# Patient Record
Sex: Male | Born: 1970 | Race: White | Hispanic: No | Marital: Married | Smoking: Current every day smoker
Health system: Southern US, Community
[De-identification: ages and names within clinical notes are randomized; demographics above are authoritative.]

---

## 2003-04-26 ENCOUNTER — Emergency Department (HOSPITAL_COMMUNITY): Admission: EM | Admit: 2003-04-26 | Discharge: 2003-04-26 | Payer: Self-pay | Admitting: Emergency Medicine

## 2007-04-09 ENCOUNTER — Emergency Department (HOSPITAL_COMMUNITY): Admission: EM | Admit: 2007-04-09 | Discharge: 2007-04-09 | Payer: Self-pay | Admitting: *Deleted

## 2009-07-08 ENCOUNTER — Emergency Department: Payer: Self-pay | Admitting: Emergency Medicine

## 2011-08-03 IMAGING — CT CT HEAD WITHOUT CONTRAST
2 series · 16 of 30 positions shown, 20 images · non-contrast
Comparison: none

REASON FOR EXAM: chainsaw hit head, numbness right side of head
COMMENTS:

PROCEDURE:     CT  - CT HEAD WITHOUT CONTRAST  - July 08, 2009  [DATE]
RESULT:     Comparison:  None
TECHNIQUE: Multiple axial images from the foramen magnum to the vertex were
obtained without IV contrast.

[Series 2: without · axial · non-contrast · 0.39mm/px · z∈[-478,-358]mm · 13 of 30 slices shown, 17 images]
[im 3/30  brain]
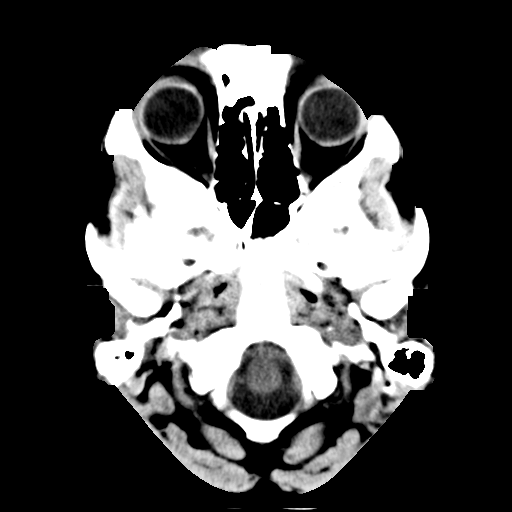
[im 3/30  bone]
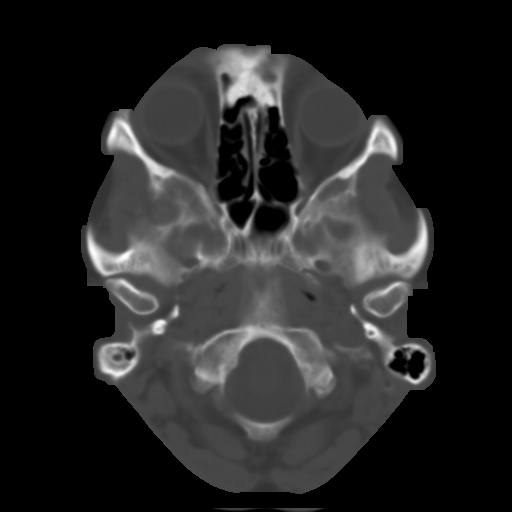
[im 5/30  brain]
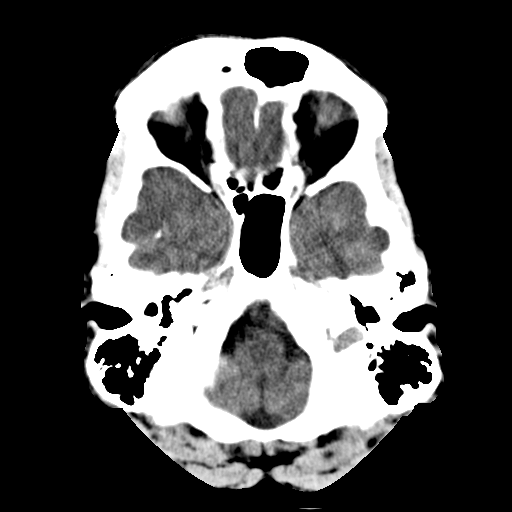
[im 7/30  brain]
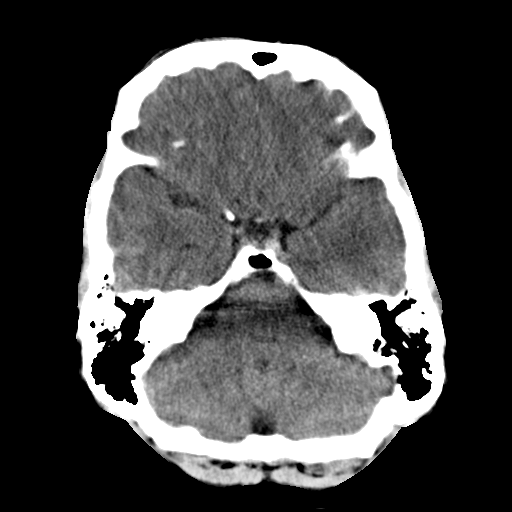
[im 9/30  brain]
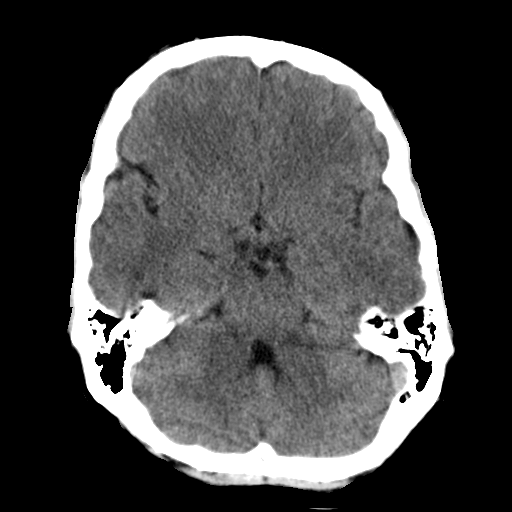
[im 11/30  brain]
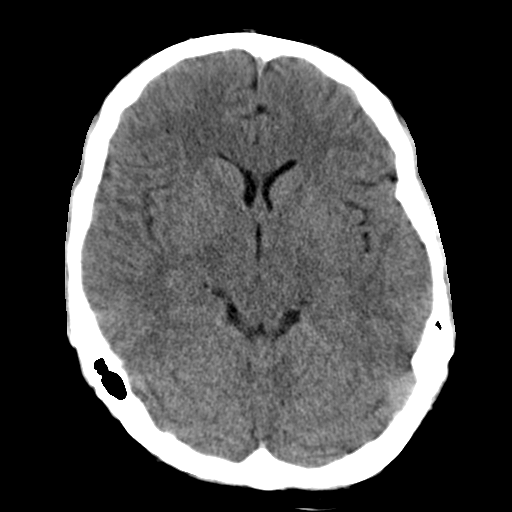
[im 11/30  bone]
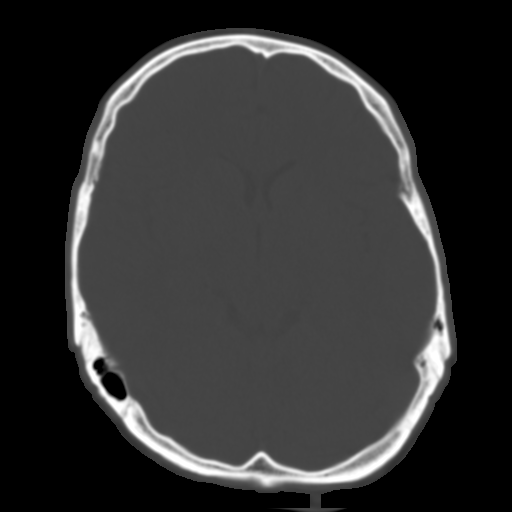
[im 13/30  brain]
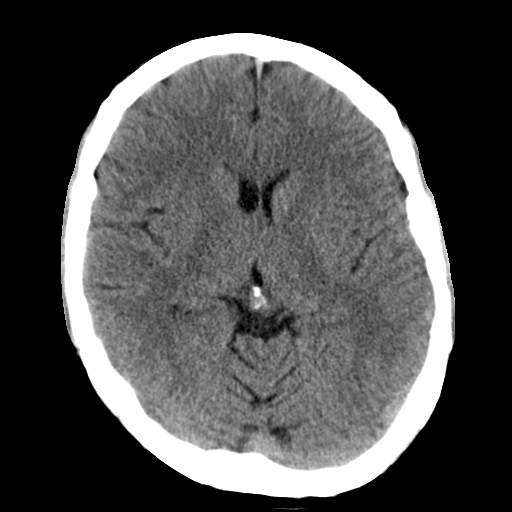
[im 15/30  brain]
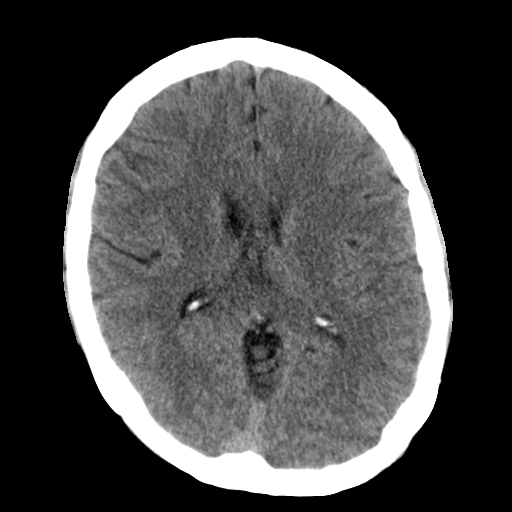
[im 17/30  brain]
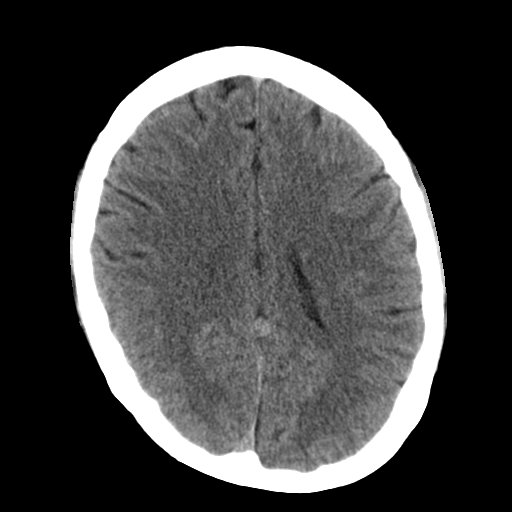
[im 19/30  brain]
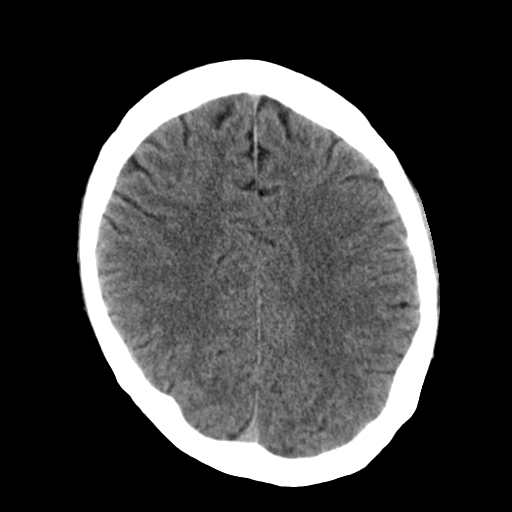
[im 19/30  bone]
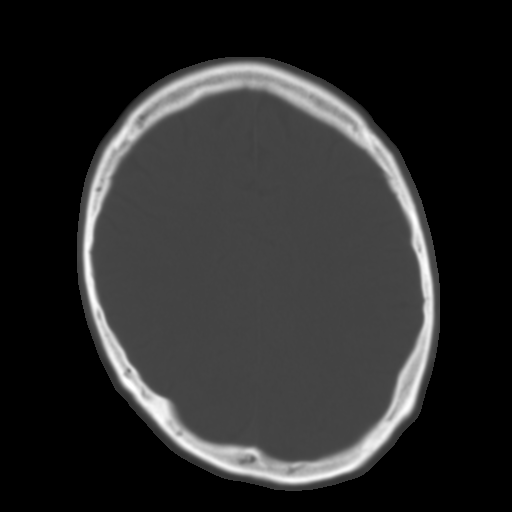
[im 21/30  brain]
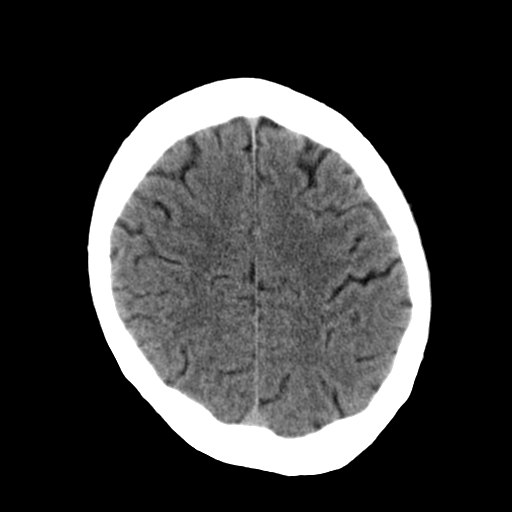
[im 23/30  brain]
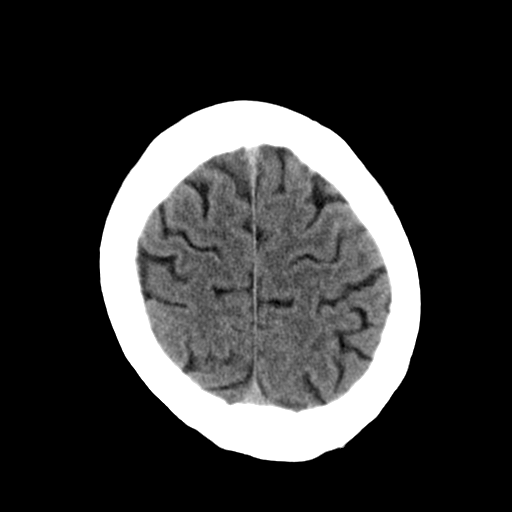
[im 25/30  brain]
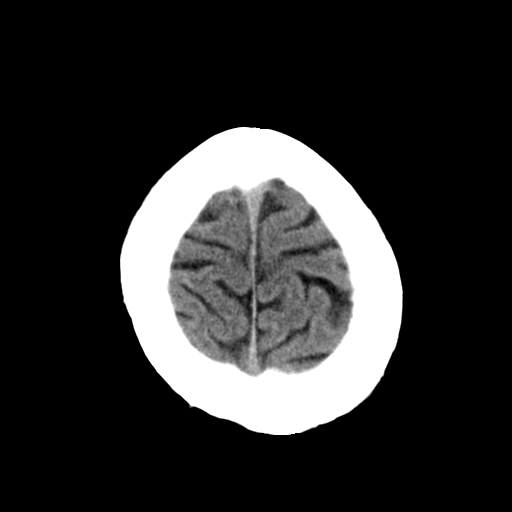
[im 27/30  brain]
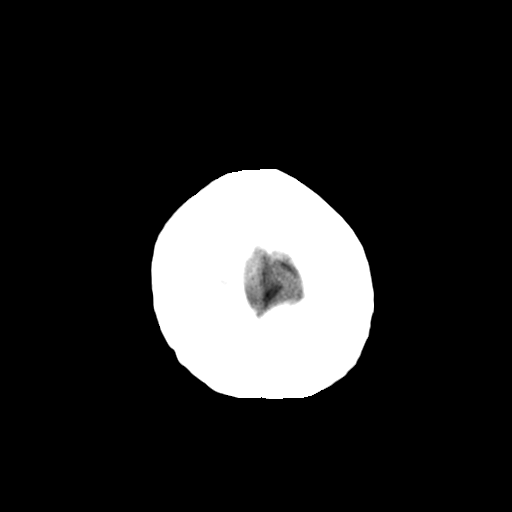
[im 27/30  bone]
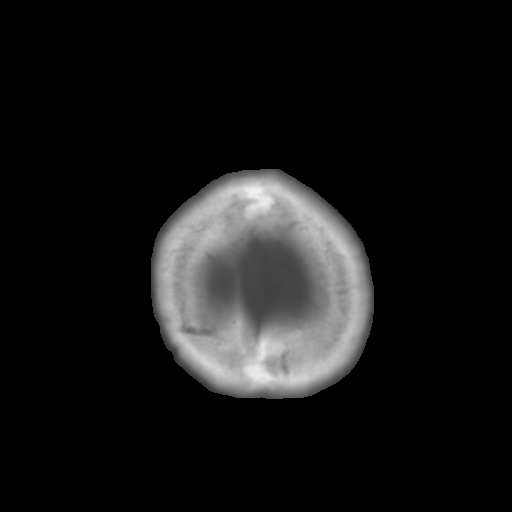

[Series 3: bone · axial · 0.39mm/px · z∈[-478,-438]mm · 3 of 30 slices shown]
[im 3/30  bone]
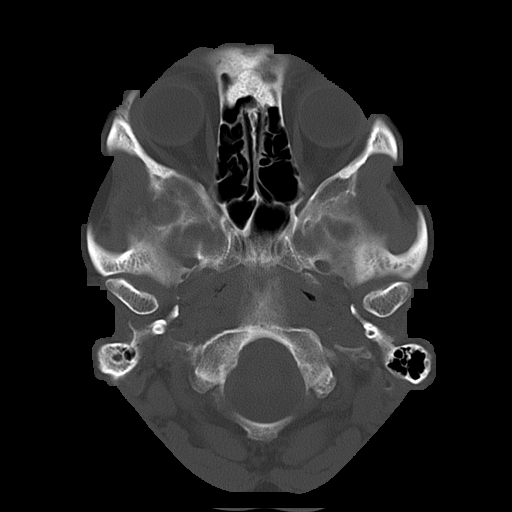
[im 7/30  bone]
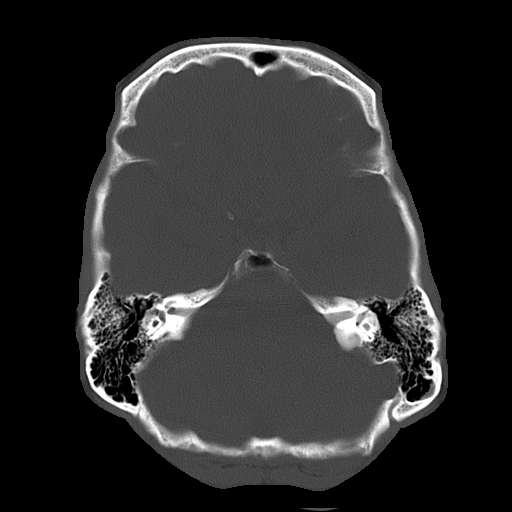
[im 11/30  bone]
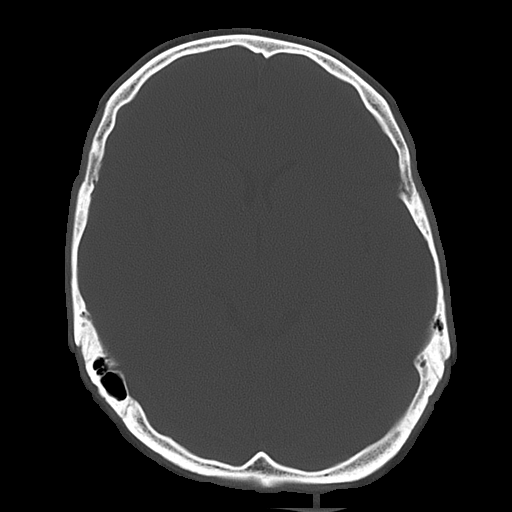

[16 of 30 positions shown; findings below may reference images not displayed]

FINDINGS: There is no evidence of mass effect, midline shift, or extra-axial fluid
collections.  There is no evidence of a space-occupying lesion or
intracranial hemorrhage. There is no evidence of a cortical-based area of
acute infarction.

The ventricles and sulci are appropriate for the patient's age. The basal
cisterns are patent.

Visualized portions of the orbits are unremarkable. The visualized portions
of the paranasal sinuses and mastoid air cells are unremarkable.

The osseous structures are unremarkable.
IMPRESSION: No acute intracranial process.

## 2016-12-11 ENCOUNTER — Emergency Department (HOSPITAL_COMMUNITY): Payer: Self-pay

## 2016-12-11 ENCOUNTER — Encounter (HOSPITAL_COMMUNITY): Payer: Self-pay

## 2016-12-11 ENCOUNTER — Observation Stay (HOSPITAL_COMMUNITY)
Admission: EM | Admit: 2016-12-11 | Discharge: 2016-12-12 | Disposition: A | Discharge status: Home / Self Care | Payer: Self-pay | Attending: Internal Medicine | Admitting: Internal Medicine

## 2016-12-11 DIAGNOSIS — F17200 Nicotine dependence, unspecified, uncomplicated: Secondary | ICD-10-CM | POA: Insufficient documentation

## 2016-12-11 DIAGNOSIS — T50901A Poisoning by unspecified drugs, medicaments and biological substances, accidental (unintentional), initial encounter: Secondary | ICD-10-CM

## 2016-12-11 DIAGNOSIS — R739 Hyperglycemia, unspecified: Secondary | ICD-10-CM | POA: Insufficient documentation

## 2016-12-11 DIAGNOSIS — G9340 Encephalopathy, unspecified: Secondary | ICD-10-CM | POA: Diagnosis present

## 2016-12-11 DIAGNOSIS — R4182 Altered mental status, unspecified: Secondary | ICD-10-CM

## 2016-12-11 DIAGNOSIS — G92 Toxic encephalopathy: Principal | ICD-10-CM | POA: Insufficient documentation

## 2016-12-11 DIAGNOSIS — F1110 Opioid abuse, uncomplicated: Secondary | ICD-10-CM | POA: Insufficient documentation

## 2016-12-11 DIAGNOSIS — R40242 Glasgow coma scale score 9-12, unspecified time: Secondary | ICD-10-CM | POA: Insufficient documentation

## 2016-12-11 DIAGNOSIS — F1910 Other psychoactive substance abuse, uncomplicated: Secondary | ICD-10-CM | POA: Diagnosis present

## 2016-12-11 LAB — CBC WITH DIFFERENTIAL/PLATELET
Basophils Absolute: 0 10*3/uL (ref 0.0–0.1)
Basophils Relative: 0 %
Eosinophils Absolute: 0.1 10*3/uL (ref 0.0–0.7)
Eosinophils Relative: 1 %
HCT: 39.3 % (ref 39.0–52.0)
Hemoglobin: 13.4 g/dL (ref 13.0–17.0)
Lymphocytes Relative: 23 %
Lymphs Abs: 2.1 10*3/uL (ref 0.7–4.0)
MCH: 31.5 pg (ref 26.0–34.0)
MCHC: 34.1 g/dL (ref 30.0–36.0)
MCV: 92.5 fL (ref 78.0–100.0)
Monocytes Absolute: 0.5 10*3/uL (ref 0.1–1.0)
Monocytes Relative: 5 %
Neutro Abs: 6.3 10*3/uL (ref 1.7–7.7)
Neutrophils Relative %: 71 %
Platelets: 374 10*3/uL (ref 150–400)
RBC: 4.25 MIL/uL (ref 4.22–5.81)
RDW: 13.3 % (ref 11.5–15.5)
WBC: 9 10*3/uL (ref 4.0–10.5)

## 2016-12-11 LAB — COMPREHENSIVE METABOLIC PANEL
ALT: 17 U/L (ref 17–63)
AST: 25 U/L (ref 15–41)
Albumin: 4.5 g/dL (ref 3.5–5.0)
Alkaline Phosphatase: 68 U/L (ref 38–126)
Anion gap: 11 (ref 5–15)
BUN: 14 mg/dL (ref 6–20)
CO2: 24 mmol/L (ref 22–32)
Calcium: 9 mg/dL (ref 8.9–10.3)
Chloride: 101 mmol/L (ref 101–111)
Creatinine, Ser: 0.94 mg/dL (ref 0.61–1.24)
GFR calc Af Amer: >60 mL/min (ref >60)
GFR calc non Af Amer: >60 mL/min (ref >60)
Glucose, Bld: 229 mg/dL — ABNORMAL HIGH (ref 65–99)
Potassium: 3.8 mmol/L (ref 3.5–5.1)
Sodium: 136 mmol/L (ref 135–145)
Total Bilirubin: 0.6 mg/dL (ref 0.3–1.2)
Total Protein: 7.2 g/dL (ref 6.5–8.1)

## 2016-12-11 LAB — RAPID URINE DRUG SCREEN, HOSP PERFORMED
Amphetamines: NOT DETECTED
Barbiturates: NOT DETECTED
Benzodiazepines: NOT DETECTED
Cocaine: NOT DETECTED
Opiates: POSITIVE — AB
Tetrahydrocannabinol: NOT DETECTED

## 2016-12-11 LAB — URINALYSIS, ROUTINE W REFLEX MICROSCOPIC
Bilirubin Urine: NEGATIVE
Glucose, UA: NEGATIVE mg/dL
Hgb urine dipstick: NEGATIVE
Ketones, ur: NEGATIVE mg/dL
Leukocytes, UA: NEGATIVE
Nitrite: NEGATIVE
Protein, ur: NEGATIVE mg/dL
Specific Gravity, Urine: 1.008 (ref 1.005–1.030)
pH: 6 (ref 5.0–8.0)

## 2016-12-11 LAB — CK: Total CK: 194 U/L (ref 49–397)

## 2016-12-11 LAB — HEMOGLOBIN A1C
Hgb A1c MFr Bld: 5.6 % (ref 4.8–5.6)
Mean Plasma Glucose: 114.02 mg/dL

## 2016-12-11 MED ORDER — LORAZEPAM 2 MG/ML IJ SOLN
1.0000 mg | Freq: Once | INTRAMUSCULAR | Status: AC
  Administered 2016-12-11: 1 mg via INTRAVENOUS

## 2016-12-11 MED ORDER — SODIUM CHLORIDE 0.9 % IV BOLUS (SEPSIS)
1000.0000 mL | Freq: Once | INTRAVENOUS | Status: AC
  Administered 2016-12-11: 1000 mL via INTRAVENOUS

## 2016-12-11 MED ORDER — ONDANSETRON HCL 4 MG/2ML IJ SOLN
INTRAMUSCULAR | Status: AC
  Administered 2016-12-11: 4 mg
  Filled 2016-12-11: qty 2

## 2016-12-11 MED ORDER — ACETAMINOPHEN 325 MG PO TABS: 650.0000 mg | ORAL_TABLET | Freq: Four times a day (QID) | ORAL | Status: DC | PRN

## 2016-12-11 MED ORDER — ONDANSETRON HCL 4 MG/2ML IJ SOLN
4.0000 mg | Freq: Four times a day (QID) | INTRAMUSCULAR | Status: DC | PRN
Start: 2016-12-11 — End: 2016-12-12

## 2016-12-11 MED ORDER — LORAZEPAM 2 MG/ML IJ SOLN
INTRAMUSCULAR | Status: AC
  Filled 2016-12-11: qty 1

## 2016-12-11 MED ORDER — SODIUM CHLORIDE 0.9 % IV SOLN
INTRAVENOUS | Status: AC
  Administered 2016-12-11: 23:00:00 via INTRAVENOUS

## 2016-12-11 MED ORDER — LORAZEPAM 2 MG/ML IJ SOLN: 2.0000 mg | INTRAMUSCULAR | Status: DC | PRN

## 2016-12-11 MED ORDER — ACETAMINOPHEN 650 MG RE SUPP: 650.0000 mg | Freq: Four times a day (QID) | RECTAL | Status: DC | PRN

## 2016-12-11 MED ORDER — INSULIN ASPART 100 UNIT/ML ~~LOC~~ SOLN: 0.0000 [IU] | SUBCUTANEOUS | Status: DC

## 2016-12-11 MED ORDER — LORAZEPAM 2 MG/ML IJ SOLN
1.0000 mg | Freq: Once | INTRAMUSCULAR | Status: AC
  Administered 2016-12-11: 1 mg via INTRAVENOUS
  Filled 2016-12-11: qty 1

## 2016-12-11 MED ORDER — ONDANSETRON HCL 4 MG PO TABS: 4.0000 mg | ORAL_TABLET | Freq: Four times a day (QID) | ORAL | Status: DC | PRN

## 2016-12-11 MED ORDER — SODIUM CHLORIDE 0.9% FLUSH: 3.0000 mL | Freq: Two times a day (BID) | INTRAVENOUS | Status: DC

## 2016-12-11 MED ORDER — BISACODYL 5 MG PO TBEC: 5.0000 mg | DELAYED_RELEASE_TABLET | Freq: Every day | ORAL | Status: DC | PRN

## 2016-12-11 MED ORDER — ENOXAPARIN SODIUM 40 MG/0.4ML ~~LOC~~ SOLN
40.0000 mg | SUBCUTANEOUS | Status: DC
  Administered 2016-12-11: 40 mg via SUBCUTANEOUS
  Filled 2016-12-11: qty 0.4

## 2016-12-11 MED ORDER — LORAZEPAM 2 MG/ML IJ SOLN
2.0000 mg | Freq: Once | INTRAMUSCULAR | Status: AC
  Administered 2016-12-11: 2 mg via INTRAVENOUS
  Filled 2016-12-11: qty 1

## 2016-12-11 MED ORDER — HYDROCODONE-ACETAMINOPHEN 5-325 MG PO TABS
1.0000 | ORAL_TABLET | ORAL | Status: DC | PRN
Start: 2016-12-11 — End: 2016-12-12

## 2016-12-11 MED ORDER — SENNOSIDES-DOCUSATE SODIUM 8.6-50 MG PO TABS: 1.0000 | ORAL_TABLET | Freq: Every evening | ORAL | Status: DC | PRN

## 2016-12-11 MED ORDER — NALOXONE HCL 0.4 MG/ML IJ SOLN
INTRAMUSCULAR | Status: AC
  Administered 2016-12-11: 0.4 mg
  Filled 2016-12-11: qty 1

## 2016-12-11 NOTE — ED Notes (Signed)
MD Tegeler at the bedside  

## 2016-12-11 NOTE — ED Notes (Signed)
Pt now arousable to painful stimuli, has both arms contacted. VSS.

## 2016-12-11 NOTE — H&P (Signed)
History and Physical    Theodore Shelton ZOX:[id number] DOB: 02/07/1971 DOA: 12/11/2016  PCP: Patient, No Pcp Per   Patient coming from: Home  Chief Complaint: Confusion, agitation after insufflating heroin  HPI: Theodore Shelton is a 46 y.o. male with medical history significant for drug abuse, now presenting to the emergency department for evaluation of confusion and agitation. Patient was reportedly at home and in his usual state, reported insufflating heroin, and was then noted to develop confusion and agitation. He was brought into the ED for evaluation by his family. He had reportedly been normal earlier in the day. There has not been any complaints earlier in the day. Unable to obtain history from the patient due to his clinical state. Significant other the bedside reports that he was acting completely normal this morning, but when out to get gas at approximately 10 AM, and reported not feeling well shortly after. He eventually admitted to snorting heroin. Significant other insisted on bringing him into the ED. He then went on to develop confusion and agitation.  ED Course: Upon arrival to the ED, patient is found to be afebrile, saturating well on room air, tachycardic, with vitals otherwise stable. Chest x-ray was negative for acute cardiopulmonary disease and head CT was negative for acute intracranial abnormality. Chemistry panel was notable for glucose of 229 and CBC was unremarkable. Serum CK was within the normal limits, urinalysis was unremarkable, and UDS was positive for opiates. Patient was treated with 2 L normal saline in the ED and multiple doses of IV Ativan. He was given Narcan in the ED, but there was no appreciable effect. Tachycardia resolved. He remained significantly altered. He will be observed in the stepdown unit for ongoing evaluation and management of acute encephalopathy suspected secondary to acute intoxication, possibly with heroin.  Review of Systems:  Unable to  obtain ROS secondary to patient's clinical condition with acute encephalopathy.  History reviewed. No pertinent past medical history.  History reviewed. No pertinent surgical history.   reports that he has been smoking.  He uses smokeless tobacco. He reports that he drinks alcohol. He reports that he uses drugs.  No Known Allergies  History reviewed. No pertinent family history.   Prior to Admission medications   Not on File    Physical Exam: Vitals:   12/11/16 1600 12/11/16 1618 12/11/16 1645 12/11/16 1700  BP: (!) 94/54 (!) 133/107 (!) 114/96 126/81  Pulse: (!) 58 75 70 (!) 59  Resp:   20 13  Temp:      SpO2: 99% 100% 100% 100%  Weight:          Constitutional: NAD, calm, comfortable, somnolent Eyes: PERTLA, lids and conjunctivae normal ENMT: Mucous membranes are moist. Posterior pharynx clear of any exudate or lesions.   Neck: normal, supple, no masses, no thyromegaly Respiratory: clear to auscultation bilaterally, no wheezing, no crackles. Normal respiratory effort.    Cardiovascular: S1 & S2 heard, regular rate and rhythm. No extremity edema.  No significant JVD. Abdomen: No distension, no tenderness, no masses palpated. Bowel sounds normal.  Musculoskeletal: no clubbing / cyanosis. No joint deformity upper and lower extremities.   Skin: no significant rashes, lesions, ulcers. Warm, dry, well-perfused. Neurologic: CN 2-12 grossly intact. Sensation intact, DTR normal. Strength 5/5 in all 4 limbs.  Psychiatric: Somnolent, easily roused.      Labs on Admission: I have personally reviewed following labs and imaging studies  CBC:  Recent Labs Lab 12/11/16 1203  WBC 9.0  NEUTROABS 6.3  HGB 13.4  HCT 39.3  MCV 92.5  PLT 374   Basic Metabolic Panel:  Recent Labs Lab 12/11/16 1203  NA 136  K 3.8  CL 101  CO2 24  GLUCOSE 229*  BUN 14  CREATININE 0.94  CALCIUM 9.0   GFR: CrCl cannot be calculated (Unknown ideal weight.). Liver Function  Tests:  Recent Labs Lab 12/11/16 1203  AST 25  ALT 17  ALKPHOS 68  BILITOT 0.6  PROT 7.2  ALBUMIN 4.5   No results for input(s): LIPASE, AMYLASE in the last 168 hours. No results for input(s): AMMONIA in the last 168 hours. Coagulation Profile: No results for input(s): INR, PROTIME in the last 168 hours. Cardiac Enzymes:  Recent Labs Lab 12/11/16 1729  CKTOTAL 194   BNP (last 3 results) No results for input(s): PROBNP in the last 8760 hours. HbA1C: No results for input(s): HGBA1C in the last 72 hours. CBG: No results for input(s): GLUCAP in the last 168 hours. Lipid Profile: No results for input(s): CHOL, HDL, LDLCALC, TRIG, CHOLHDL, LDLDIRECT in the last 72 hours. Thyroid Function Tests: No results for input(s): TSH, T4TOTAL, FREET4, T3FREE, THYROIDAB in the last 72 hours. Anemia Panel: No results for input(s): VITAMINB12, FOLATE, FERRITIN, TIBC, IRON, RETICCTPCT in the last 72 hours. Urine analysis:    Component Value Date/Time   COLORURINE YELLOW 12/11/2016 1737   APPEARANCEUR CLEAR 12/11/2016 1737   LABSPEC 1.008 12/11/2016 1737   PHURINE 6.0 12/11/2016 1737   GLUCOSEU NEGATIVE 12/11/2016 1737   HGBUR NEGATIVE 12/11/2016 1737   BILIRUBINUR NEGATIVE 12/11/2016 1737   KETONESUR NEGATIVE 12/11/2016 1737   PROTEINUR NEGATIVE 12/11/2016 1737   NITRITE NEGATIVE 12/11/2016 1737   LEUKOCYTESUR NEGATIVE 12/11/2016 1737   Sepsis Labs: @LABRCNTIP (procalcitonin:4,lacticidven:4) )No results found for this or any previous visit (from the past 240 hour(s)).   Radiological Exams on Admission: Dg Chest 1 View  Result Date: 12/11/2016 CLINICAL DATA:  Lollie Sails no overdose. Shortness of breath with tachycardia. EXAM: CHEST 1 VIEW COMPARISON:  None. FINDINGS: Suboptimal inspiration with right hemidiaphragm elevation. The heart size and mediastinal contours are normal. The lungs are clear. There is no pleural effusion or pneumothorax. No acute osseous findings are identified.  Telemetry leads overlie the chest. IMPRESSION: Suboptimal inspiration.  No acute cardiopulmonary process. Electronically Signed   By: Carey Bullocks M.D.   On: 12/11/2016 14:29   Ct Head Wo Contrast  Result Date: 12/11/2016 CLINICAL DATA:  Altered level of consciousness EXAM: CT HEAD WITHOUT CONTRAST TECHNIQUE: Contiguous axial images were obtained from the base of the skull through the vertex without intravenous contrast. COMPARISON:  July 08, 2009 FINDINGS: Brain: The ventricles are normal in size and configuration. There is no intracranial mass, hemorrhage, extra-axial fluid collection, or midline shift. Gray-white compartments appear normal. No evident acute infarct. Vascular: No hyperdense vessel. There is no appreciable vascular calcification. Skull: Bony calvarium appears intact. Sinuses/Orbits: There is mucosal thickening and opacification in several ethmoid air cells bilaterally. There is mucosal thickening in the inferior left maxillary antrum. There is mucosal thickening in the right sphenoid sinus. Orbits appear symmetric bilaterally. Other: Mastoid air cells are clear. IMPRESSION: Paranasal sinus disease at several sites. No intracranial mass, hemorrhage, or extra-axial fluid collection. Gray-white compartments appear normal. Electronically Signed   By: Bretta Bang III M.D.   On: 12/11/2016 18:20    EKG: Not performed.   Assessment/Plan  1. Acute encephalopathy  - Pt presents with confusion and agitation that started earlier in the  day  - Family reports that pt was snorting something he though was heroin  - UDS is positive for opiate, but reportedly no effect when Narcan administered in ED  - Head CT is negative for acute intracranial abnormality  - Suspect this is related to acute intoxication and anticipate resolution in coming hours  - Plan to observe in stepdown unit overnight with supportive care    2. Drug abuse  - UDS positive for opiates  - Pt reported to family that  he snorted heroin on day of admission  - Social work consultation requested   3. Hyperglycemia  - Serum glucose 229 in ED - Check CBG's q4h and start a low-intensity SSI with Novolog    DVT prophylaxis: sq Lovenox Code Status: Full  Family Communication: Discussed with patient Disposition Plan: Observe in SDU Consults called: None Admission status: Observation    Briscoe Deutscher, MD [facility] Pager [phone]  If 7PM-7AM, please contact night-coverage [email or web address] Password [facility]  12/11/2016, 7:42 PM

## 2016-12-11 NOTE — ED Triage Notes (Addendum)
Pt presents to the ed with family after doing heroin today. The patient is lethargic in triage sats 86% hr 160, pt taken straight back to room.

## 2016-12-11 NOTE — Progress Notes (Signed)
SO brought cell phone and wallet. He wants to keep it at bedside due to contact information etc. So understands that we are not responsible for valuables.

## 2016-12-11 NOTE — ED Notes (Signed)
As this RN was taking pt upstairs, pt began talking, oriented to self place and time, following all commands. Pt still lethargic but alert.

## 2016-12-11 NOTE — ED Provider Notes (Signed)
[facility]-EMERGENCY DEPT Provider Note   CSN: [id number] Arrival date & time: 12/11/16  1141     History   Chief Complaint Chief Complaint  Patient presents with  . Drug Overdose    HPI Theodore Shelton is a 46 y.o. male.  The history is provided by the patient, the spouse and medical records.  Ingestion  This is a new problem. The current episode started 1 to 2 hours ago. The problem occurs constantly. The problem has not changed since onset.Pertinent negatives include no chest pain, no abdominal pain, no headaches and no shortness of breath. Nothing aggravates the symptoms. Nothing relieves the symptoms. He has tried nothing for the symptoms.    History reviewed. No pertinent past medical history.  There are no active problems to display for this patient.   History reviewed. No pertinent surgical history.     Home Medications    Prior to Admission medications   Not on File    Family History No family history on file.  Social History Social History  Substance Use Topics  . Smoking status: Current Every Day Smoker  . Smokeless tobacco: Current User  . Alcohol use Yes     Allergies   Patient has no known allergies.   Review of Systems Review of Systems  Unable to perform ROS: Mental status change  HENT: Negative for congestion and dental problem.   Respiratory: Negative for cough, chest tightness and shortness of breath.   Cardiovascular: Negative for chest pain and palpitations.  Gastrointestinal: Negative for abdominal pain, nausea and vomiting.  Genitourinary: Negative for flank pain.  Musculoskeletal: Negative for back pain and neck pain.  Skin: Negative for wound.  Neurological: Negative for syncope and headaches.  Psychiatric/Behavioral: Positive for agitation and confusion.     Physical Exam Updated Vital Signs BP 128/70   Pulse (!) 160   Temp 98.7 F (37.1 C)   Resp 16   Wt 66.2 kg (146 lb)   SpO2 (!) 86%   Physical Exam    Constitutional: He appears well-developed and well-nourished. He appears lethargic. He appears distressed.  HENT:  Head: Normocephalic.  Mouth/Throat: Oropharynx is clear and moist. No oropharyngeal exudate.  Eyes: Pupils are equal, round, and reactive to light. Conjunctivae and EOM are normal.  Neck: Normal range of motion.  Cardiovascular: Normal rate and intact distal pulses.   No murmur heard. Pulmonary/Chest: Effort normal. No stridor. No respiratory distress. He has no rales. He exhibits no tenderness.  Abdominal: Soft. There is no tenderness.  Musculoskeletal: He exhibits no edema or tenderness.  Neurological: He appears lethargic. He displays no tremor. No cranial nerve deficit or sensory deficit. He exhibits abnormal muscle tone (pt is hypertonic). He displays no seizure activity. GCS eye subscore is 3. GCS verbal subscore is 3. GCS motor subscore is 6.  Skin: Capillary refill takes less than 2 seconds. He is not diaphoretic. No erythema. No pallor.  Nursing note and vitals reviewed.    ED Treatments / Results  Labs (all labs ordered are listed, but only abnormal results are displayed) Labs Reviewed  RAPID URINE DRUG SCREEN, HOSP PERFORMED - Abnormal; Notable for the following:       Result Value   Opiates POSITIVE (*)    All other components within normal limits  COMPREHENSIVE METABOLIC PANEL - Abnormal; Notable for the following:    Glucose, Bld 229 (*)    All other components within normal limits  CBC - Abnormal; Notable for the following:  RBC 3.76 (*)    Hemoglobin 11.4 (*)    HCT 34.8 (*)    All other components within normal limits  BASIC METABOLIC PANEL - Abnormal; Notable for the following:    Glucose, Bld 100 (*)    Calcium 8.7 (*)    All other components within normal limits  GLUCOSE, CAPILLARY - Abnormal; Notable for the following:    Glucose-Capillary 110 (*)    All other components within normal limits  MRSA PCR SCREENING  URINALYSIS, ROUTINE W  REFLEX MICROSCOPIC  CBC WITH DIFFERENTIAL/PLATELET  CK  HEMOGLOBIN A1C  GLUCOSE, CAPILLARY  GLUCOSE, CAPILLARY  HIV ANTIBODY (ROUTINE TESTING)    EKG  EKG Interpretation None       Radiology Dg Chest 1 View  Result Date: 12/11/2016 CLINICAL DATA:  Lollie Sails no overdose. Shortness of breath with tachycardia. EXAM: CHEST 1 VIEW COMPARISON:  None. FINDINGS: Suboptimal inspiration with right hemidiaphragm elevation. The heart size and mediastinal contours are normal. The lungs are clear. There is no pleural effusion or pneumothorax. No acute osseous findings are identified. Telemetry leads overlie the chest. IMPRESSION: Suboptimal inspiration.  No acute cardiopulmonary process. Electronically Signed   By: Carey Bullocks M.D.   On: 12/11/2016 14:29    Procedures Procedures (including critical care time)  Medications Ordered in ED Medications  sodium chloride 0.9 % bolus 1,000 mL (not administered)  naloxone (NARCAN) 0.4 MG/ML injection (0.4 mg  Given 12/11/16 1224)  sodium chloride 0.9 % bolus 1,000 mL (0 mLs Intravenous Stopped 12/11/16 1318)  ondansetron (ZOFRAN) 4 MG/2ML injection (4 mg  Given 12/11/16 1315)  LORazepam (ATIVAN) injection 2 mg (2 mg Intravenous Given 12/11/16 1315)  LORazepam (ATIVAN) injection 1 mg (1 mg Intravenous Given 12/11/16 1327)     Initial Impression / Assessment and Plan / ED Course  I have reviewed the triage vital signs and the nursing notes.  Pertinent labs & imaging results that were available during my care of the patient were reviewed by me and considered in my medical decision making (see chart for details).     Theodore Shelton is a 46 y.o. male with past medical history significant for substance abuse who presents with accidental overdose. Patient is brought in by family for altered mental status and lethargy. Patient reports that he snorted what he thought was heroin today. Patient found to have oxygen saturations in the mid 80s on arrival. Patient  denies any preceding symptoms such as fevers, chills, chest pain, shortness of breath, cough, congestion, abdominal pain, nausea, vomiting, constipation, diarrhea, or dysuria. Patient denies taking any other medications aside from the snorting heroin. He denies any injectable heroin use. He does not take any other medications and has no other medical history.  Patient was examined and had pupils that were 3 mm and reactive bilaterally. Not significantly pinpoint pupils however, given the hypoxia and lethargy, decision was made to try Narcan. Narcan was given and patient had no significant change in appearance. Patient had workup including labs, was given fluids, and was also given a chest x-ray.  After a while, patient began acting more agitated. He began trying to get out of bed. He began having muscle tightening and was contracting his arms. Patient was still able to answer some questions appropriately.  Suspect the patient may have had a co-ingestants with the heroin unintentionally. The Narcan likely reversed the opioid and now the patient is having a reaction to the other substance. Suspect a stimulant such as cocaine, amphetamines, or another  drug like PCP given the agitation. Next  Patient was given Ativan with improvement in his agitation.  Patient will be given more fluids and was given more Ativan for further agitation spells. Patient was not febrile.Do not suspect neuroleptic malignant syndrome or serotonin syndrome at this time. Suspect patient is having reaction to his drug ingestion.   Patient will have CK evaluated given the muscle contractions and a CT head.  Anticipate admission for continued altered mental status in the setting of the accidental overdose.   Final Clinical Impressions(s) / ED Diagnoses   Final diagnoses:  Accidental drug overdose, initial encounter  Altered mental status, unspecified altered mental status type     Clinical Impression: 1. Accidental drug  overdose, initial encounter   2. Altered mental status   3. Altered mental status, unspecified altered mental status type     Disposition: Care transferred to Dr. Lynelle Doctor to await reassessment     Tegeler, Canary Brim, MD 12/12/16 [phone]

## 2016-12-11 NOTE — ED Notes (Signed)
Pt writhing and hard to control. Pt rolling around in the bed and pulling arms and legs up and down frantically.

## 2016-12-11 NOTE — ED Notes (Signed)
Pt taken to CT. Pt appears more relaxed now. Pt stated his first and last name to this RN. Pt still stiff in extremities and encouraged to relax.

## 2016-12-11 NOTE — ED Provider Notes (Signed)
CT scan is negative.  Pt is still very drowsy but will speak to me when I speak to him.  Earlier had episodes of ?seizure like activity.  Seems to be improving but with his persistent mental status changes.  Will consult for admission, observation.   Linwood Dibbles, MD 12/11/16 Norberta Keens

## 2016-12-12 DIAGNOSIS — G9340 Encephalopathy, unspecified: Secondary | ICD-10-CM

## 2016-12-12 LAB — BASIC METABOLIC PANEL
Anion gap: 7 (ref 5–15)
BUN: 7 mg/dL (ref 6–20)
CALCIUM: 8.7 mg/dL — AB (ref 8.9–10.3)
CHLORIDE: 106 mmol/L (ref 101–111)
CO2: 28 mmol/L (ref 22–32)
CREATININE: 0.72 mg/dL (ref 0.61–1.24)
GFR calc non Af Amer: >60 mL/min (ref >60)
Glucose, Bld: 100 mg/dL — ABNORMAL HIGH (ref 65–99)
Potassium: 3.9 mmol/L (ref 3.5–5.1)
SODIUM: 141 mmol/L (ref 135–145)

## 2016-12-12 LAB — CBC
HCT: 34.8 % — ABNORMAL LOW (ref 39.0–52.0)
HEMOGLOBIN: 11.4 g/dL — AB (ref 13.0–17.0)
MCH: 30.3 pg (ref 26.0–34.0)
MCHC: 32.8 g/dL (ref 30.0–36.0)
MCV: 92.6 fL (ref 78.0–100.0)
Platelets: 266 10*3/uL (ref 150–400)
RBC: 3.76 MIL/uL — ABNORMAL LOW (ref 4.22–5.81)
RDW: 13.4 % (ref 11.5–15.5)
WBC: 5.6 10*3/uL (ref 4.0–10.5)

## 2016-12-12 LAB — GLUCOSE, CAPILLARY
GLUCOSE-CAPILLARY: 92 mg/dL (ref 65–99)
GLUCOSE-CAPILLARY: 95 mg/dL (ref 65–99)
GLUCOSE-CAPILLARY: 164 mg/dL — AB (ref 65–99)
Glucose-Capillary: 110 mg/dL — ABNORMAL HIGH (ref 65–99)

## 2016-12-12 LAB — MRSA PCR SCREENING: MRSA BY PCR: NEGATIVE

## 2016-12-12 LAB — HIV ANTIBODY (ROUTINE TESTING W REFLEX): HIV SCREEN 4TH GENERATION: NONREACTIVE

## 2016-12-12 NOTE — Plan of Care (Signed)
Problem: Education: Goal: Knowledge of [facility] Education information/materials will improve Outcome: Not Progressing Patient sommulent unable to process at this time  Comments: Patient admitted, very somnolent, sedate . Moved from stretcher to bed, stood to void, resting with eyes closed. Will reassess as patient becomes more alert

## 2016-12-12 NOTE — Progress Notes (Signed)
Stood up on side of bed, voided in urinal. Asked patient if he remembered anything he stated "some of it" then went back to bed eyes closed.

## 2016-12-12 NOTE — Progress Notes (Signed)
Belly button piercing silver colored  ball

## 2016-12-12 NOTE — Progress Notes (Signed)
Patient is somber but slightly agitated. Keeps asking when he will be able to go home. Informed him doctors will let him know during rounds. Poor appetite and little interest in anything besides sleep.

## 2016-12-12 NOTE — Discharge Instructions (Signed)
Finding Treatment for Addiction What is addiction? Addiction is a complex disease of the brain. It causes an uncontrollable (compulsive) need for a substance. You can be addicted to alcohol, illegal drugs, or prescription medicines such as painkillers. Addiction can also be a behavior, like gambling or shopping. The need for the drug or activity can become so strong that you think about it all the time. You can also become physically dependent on a substance. Addiction can change the way your brain works. Because of these changes, getting more of whatever you are addicted to becomes the most important thing to you and feels better than other activities or relationships. Addiction can lead to changes in health, behavior, emotions, relationships, and choices that affect you and everyone around you. How do I know if I need treatment for addiction? Addiction is a progressive disease. Without treatment, addiction can get worse. Living with addiction puts you at higher risk for injury, poor health, lost employment, loss of money, and even death. You might need treatment for addiction if:  You have tried to stop or cut down, but you cannot.  Your addiction is causing physical health problems.  You find it annoying that your friends and family are concerned about your alcohol or substance use.  You feel guilty about substance abuse or a compulsive behavior.  You have lied or tried to hide your addiction.  You need a particular substance or activity to start your day or to calm down.  You are getting in trouble at school, work, home, or with the police.  You have done something illegal to support your addiction.  You are running out of money because of your addiction.  You have no time for anything other than your addiction.  What types of treatment are available? The treatment program that is right for you will depend on many factors, including the type of addiction you have. Treatment programs  can be outpatient or inpatient. In an outpatient program, you live at home and go to work or school, but you also go to a clinic for treatment. With an inpatient program, you live and sleep at the program facility during treatment. After treatment, you might need a plan for support during recovery. Other treatment options include:  Medicine. ? Some addictions may be treated with prescription medicines. ? You might also need medicine to treat anxiety or depression.  Counseling and behavior therapy. Therapy can help individuals and families behave in healthier ways and relate more effectively.  Support groups. Confidential group therapy, such as a 12-step program, can help individuals and families during treatment and recovery.  No single type of program is right for everyone. Many treatment programs involve a combination of education, counseling, and a 12-step, spiritually-based approach. Some treatment programs are government sponsored. They are geared for patients who do not have private insurance. Treatment programs can vary in many respects, such as:  Cost and types of insurance that are accepted.  Types of on-site medical services that are offered.  Length of stay, setting, and size.  Overall philosophy of treatment.  What should I consider when selecting a treatment program? It is important to think about your individual requirements when selecting a treatment program. There are a number of things to consider, such as:  If the program is certified by the appropriate government agency. Even private programs must be certified and employ certified professionals.  If the program is covered by your insurance. If finances are a concern, the first call you   should make is to your insurance company, if you have health insurance. Ask for a list of treatment programs that are in your network, and confirm any copayments and deductibles that you may have to pay. ? If you do not have insurance, or  if you choose to attend a program that does not accept your insurance, discuss whether a payment plan can be set up.  If treatment is available in languages other than English, if needed.  If the program offers detoxification treatment, if needed.  If 12-step meetings are held at the center or if transport is available for patients to attend meetings at other locations.  If the program is professional, organized, and clean.  If the program meets all of your needs, including physical and cultural needs.  If the facility offers specific treatment for your particular addiction.  If support continues to be offered after you have left the program.  If your treatment plan is continually looked at to make sure you are receiving the right treatment at the right time.  If mental health counseling is part of your treatment.  If medicine is included in treatment, if needed.  If your family is included in your treatment plan and if support is offered to them throughout the treatment process.  How the treatment works to prevent relapse.  Where else can I get help?  Your health care provider. Ask him or her to help you find addiction treatment. These discussions are confidential.  The National Council on Alcoholism and Drug Dependence (NCADD). This group has information about treatment centers and programs for people who have an addiction and for family members. ? The telephone number is 1-800-NCA-CALL (1-800-622-2255). ? The website is https://ncadd.org/about-ncadd/our-affiliates  The Substance Abuse and Mental Health Services Administration (SAMHSA). This group will help you find publicly funded treatment centers, help hotlines, and counseling services near you. ? The telephone number is 1-800-662-HELP (1-800-662-4357). ? The website is www.findtreatment.samhsa.gov In countries outside of the [place] and [place], look in local directories for contact information for services in your area. This  information is not intended to replace advice given to you by your health care provider. Make sure you discuss any questions you have with your health care provider. Document Released: 03/12/2005 Document Revised: 03/09/2016 Document Reviewed: 01/30/2014 Elsevier Interactive Patient Education  2017 Elsevier Inc.  

## 2016-12-12 NOTE — Progress Notes (Signed)
Awakening more, knows where he is, what year it is. Able to answer questions, still slightly unsteady on feet and forgetful. Did not know how long he had been here. Getting dressed because " Im cold" warm blankets provided. Resting eyes closed.

## 2016-12-12 NOTE — Discharge Summary (Signed)
DISCHARGE SUMMARY  Theodore Shelton  MR#: [id number]  DOB:March 14, 1971  Date of Admission: 12/11/2016 Date of Discharge: 12/12/2016  Attending Physician:MCCLUNG,JEFFREY T  Patient's MLY:[id number], No Pcp Per  Consults:  none  Disposition: D/C home   Follow-up Appts: Follow-up Information    [facility]. Schedule an appointment as soon as possible for a visit in 1 week(s).   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 male with a history of drug abuse who presented to the ED w/ confusion and agitation. Patient was at home insufflating heroin, and was then noted to develop confusion and agitation. He was brought into the ED by his family.   Upon arrival to the ED, patient was found to be afebrile, saturating well on room air, tachycardic, with vitals otherwise stable. Chest x-ray was negative for acute cardiopulmonary disease and head CT was negative for acute intracranial abnormality.  UDS was positive for opiates.   Hospital Course: The patient was admitted to the acute units.  Without any specific treatment the patient's symptoms resolved.  On 8/18 the patient was anxious to be discharged home.  The physician met with the patient and found no significant findings on physical exam.  The patient was alert and oriented 4.  The patient was counseled directly by the physician that he should discontinue all substances of abuse immediately and warned that continuing to do so would very likely lead to his death.  He was offered counseling by social work in regards to community resources to aid him in abstaining but he chose not to wait to meet with the Education officer, museum.  Allergies as of 12/12/2016   No Known Allergies     Medication List    You have  not been prescribed any medications.     Day of Discharge BP 114/83 (BP Location: Right Arm)   Pulse 65   Temp 98.2 F (36.8 C) (Oral)   Resp 12   Ht 5' 8"  (1.727 m)   Wt 67.4 kg (148 lb 9.4 oz)   SpO2 98%   BMI 22.59 kg/m   Physical Exam: General: No acute respiratory distress Lungs: Clear to auscultation bilaterally without wheezes or crackles Cardiovascular: Regular rate and rhythm without murmur gallop or rub normal S1 and S2 Abdomen: Nontender, nondistended, soft, bowel sounds positive, no rebound, no ascites, no appreciable mass Extremities: No significant cyanosis, clubbing, or edema bilateral lower extremities  Basic Metabolic Panel:  Recent Labs Lab 12/11/16 1203 12/12/16 0433  NA 136 141  K 3.8 3.9  CL 101 106  CO2 24 28  GLUCOSE 229* 100*  BUN 14 7  CREATININE 0.94 0.72  CALCIUM 9.0 8.7*    Liver Function Tests:  Recent Labs Lab 12/11/16 1203  AST 25  ALT 17  ALKPHOS 68  BILITOT 0.6  PROT 7.2  ALBUMIN 4.5   CBC:  Recent Labs Lab 12/11/16 1203 12/12/16 0433  WBC 9.0 5.6  NEUTROABS 6.3  --   HGB 13.4 11.4*  HCT 39.3 34.8*  MCV 92.5 92.6  PLT 374 266    Cardiac Enzymes:  Recent Labs Lab 12/11/16 1729  CKTOTAL 194    CBG:  Recent Labs Lab 12/11/16 2323 12/12/16 0349 12/12/16 0737  GLUCAP 92 110* 95    Recent Results (from the past  240 hour(s))  MRSA PCR Screening     Status: None   Collection Time: 12/11/16 11:04 PM  Result Value Ref Range Status   MRSA by PCR NEGATIVE NEGATIVE Final    Comment:        The GeneXpert MRSA Assay (FDA approved for NASAL specimens only), is one component of a comprehensive MRSA colonization surveillance program. It is not intended to diagnose MRSA infection nor to guide or monitor treatment for MRSA infections.      Time spent in discharge (includes decision making & examination of pt): 30 minutes  12/12/2016, 11:53 AM   Cherene Altes, MD [facility] Office   [phone] Pager [phone]  On-Call/Text Page:      [email or web address]      password [facility]

## 2019-01-06 IMAGING — DX DG CHEST 1V
1 series · 1 of 1 positions shown · non-contrast
Comparison: None.

CLINICAL DATA: Aljovic no overdose. Shortness of breath with
tachycardia.

EXAM:
CHEST 1 VIEW

[x chest ap]
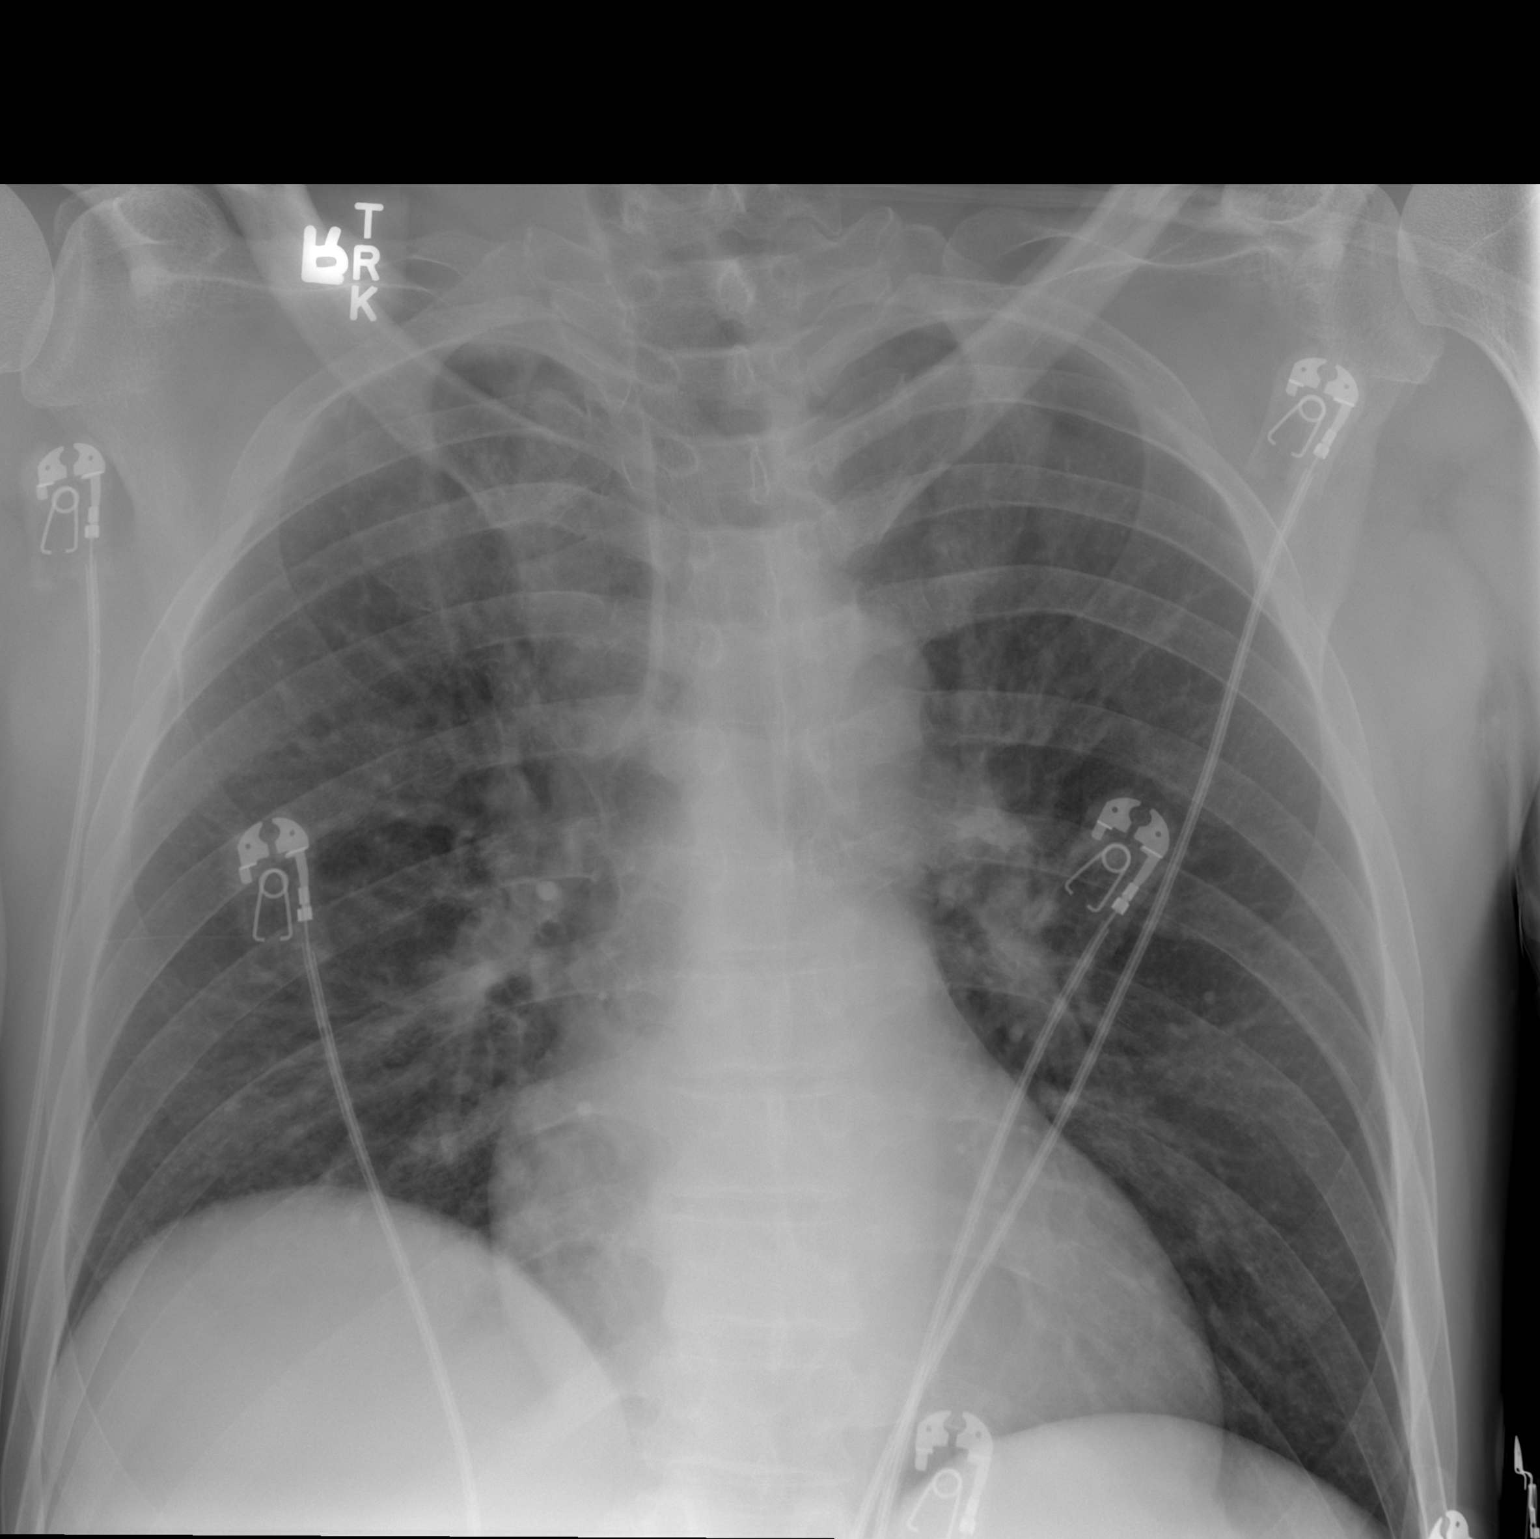

[1 of 1 positions shown; findings below may reference images not displayed]

FINDINGS: Suboptimal inspiration with right hemidiaphragm elevation. The heart
size and mediastinal contours are normal. The lungs are clear. There
is no pleural effusion or pneumothorax. No acute osseous findings
are identified. Telemetry leads overlie the chest.
IMPRESSION: Suboptimal inspiration.  No acute cardiopulmonary process.

## 2020-12-24 ENCOUNTER — Ambulatory Visit
Admission: EM | Admit: 2020-12-24 | Discharge: 2020-12-24 | Disposition: A | Discharge status: Home / Self Care | Payer: Self-pay | Attending: Emergency Medicine | Admitting: Emergency Medicine

## 2020-12-24 ENCOUNTER — Other Ambulatory Visit: Payer: Self-pay

## 2020-12-24 DIAGNOSIS — R21 Rash and other nonspecific skin eruption: Secondary | ICD-10-CM

## 2020-12-24 DIAGNOSIS — W57XXXA Bitten or stung by nonvenomous insect and other nonvenomous arthropods, initial encounter: Secondary | ICD-10-CM

## 2020-12-24 MED ORDER — HYDROXYZINE HCL 25 MG PO TABS: 25.0000 mg | ORAL_TABLET | Freq: Four times a day (QID) | ORAL | 0 refills | Status: AC | PRN

## 2020-12-24 MED ORDER — MUPIROCIN 2 % EX OINT: 1.0000 "application " | TOPICAL_OINTMENT | Freq: Two times a day (BID) | CUTANEOUS | 0 refills | Status: AC

## 2020-12-24 MED ORDER — PREDNISONE 10 MG (21) PO TBPK: ORAL_TABLET | ORAL | 0 refills | Status: AC

## 2020-12-24 NOTE — Discharge Instructions (Addendum)
I suspect that these are chigger bites.  Try Claritin or Zyrtec, and if that does not work, then Atarax.  Atarax also help you sleep.  The prednisone will help with the itching.  Bactroban or bacitracin.  Below is a list of primary care practices who are taking new patients for you to follow-up with.  [facility] internal medicine clinic Ground Floor - [facility], [address] [place], [place], [place] [address] [phone]  [facility] at [facility] [address] [place], [place] [address] [phone]  [facility] [address] [place], [place] [address] [phone]  [facility] [phone] [address] [place] [address]  [facility]: [address] [place] [address]  [phone]  [facility]: [address] [place] [address]  [phone]  [place] [facility] : [address] [place] [address] [phone]  [facility]: [address] [place] [address] [phone]  [facility]: [address] [place] [address] [phone]  Dr. Oneal Grout [address] [facility] [place] [address]  [phone]  Go to www.goodrx.com  or www.costplusdrugs.com to look up your medications. This will give you a list of where you can find your prescriptions at the most affordable prices. Or ask the pharmacist what the cash price is, or if they have any other discount programs available to help make your medication more affordable. This can be less expensive than what you would pay with insurance.

## 2020-12-24 NOTE — ED Triage Notes (Signed)
Three day h/o pruritic rash on torso and BLE.  Has been using cortisone cream with a decrease in itchiness. Also using dial soap and calamine lotion with temporary relief. Pt is unsure of any exposure. Pain is interfering with his sleep.

## 2020-12-24 NOTE — ED Provider Notes (Signed)
HPI  SUBJECTIVE:  Theodore Shelton is a 50 y.o. male who presents with 3 days of a constant, intensely pruritic rash on his torso, legs.  It has not changed since it started.  It is not spreading.  He states he cannot sleep secondary to the itching.  He is working in a Building services engineer, and has been outside.  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  No sensation of being bitten at night, blood on the bedclothes in the morning, fevers, body aches, lymphadenopathy, flulike symptoms, back pain, itching worse at night, new lotions, soaps, detergents, recent medications, contacts with a similar rash.  He tried calamine and cortisone 10 without improvement in his symptoms.  No aggravating factors.  Past medical history negative for diabetes or MRSA.  PMD: None.   History reviewed. No pertinent past medical history.  History reviewed. No pertinent surgical history.  History reviewed. No pertinent family history.  Social History   Tobacco Use   Smoking status: Every Day    Types: Cigarettes   Smokeless tobacco: Current  Substance Use Topics   Alcohol use: Yes   Drug use: Yes    No current facility-administered medications for this encounter.  Current Outpatient Medications:    hydrOXYzine (ATARAX/VISTARIL) 25 MG tablet, Take 1 tablet (25 mg total) by mouth every 6 (six) hours as needed for itching., Disp: 20 tablet, Rfl: 0   mupirocin ointment (BACTROBAN) 2 %, Apply 1 application topically 2 (two) times daily., Disp: 22 g, Rfl: 0   predniSONE (STERAPRED UNI-PAK 21 TAB) 10 MG (21) TBPK tablet, Dispense one 6 day pack. Take as directed with food., Disp: 21 tablet, Rfl: 0  No Known Allergies   ROS  As noted in HPI.   Physical Exam  BP 139/79 (BP Location: Right Arm)   Pulse 60   Temp 97.8 F (36.6 C) (Oral)   Resp 18   SpO2 96%   Constitutional: Well developed, well nourished, no acute distress Eyes:  EOMI, conjunctiva normal bilaterally HENT: Normocephalic,  atraumatic,mucus membranes moist Respiratory: Normal inspiratory effort Cardiovascular: Normal rate GI: nondistended skin: Scattered papular, pustular rash on bilateral lower extremities, around waistband.  Positive single bulla.         Musculoskeletal: no deformities Neurologic: Alert & oriented x 3, no focal neuro deficits Psychiatric: Speech and behavior appropriate   ED Course   Medications - No data to display  No orders of the defined types were placed in this encounter.   No results found for this or any previous visit (from the past 24 hour(s)). No results found.  ED Clinical Impression  1. 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  Bedbugs in the differential.  He does not appear to be scabies.  Seriously doubt monkeypox in the absence of other symptoms and based on history.  home with Claritin or Zyrtec, and if that does not work, then Atarax.  6-day prednisone taper for the itching, Bactroban, and if this is too expensive, he can use bacitracin instead.  Will provide primary care list and order assistance in finding a PMD.  Discussed MDM, treatment plan, and plan for follow-up with patient.  patient agrees with plan.   Meds ordered this encounter  Medications   mupirocin ointment (BACTROBAN) 2 %  Sig: Apply 1 application topically 2 (two) times daily.    Dispense:  22 g    Refill:  0   hydrOXYzine (ATARAX/VISTARIL) 25 MG tablet    Sig: Take 1 tablet (25 mg total) by mouth every 6 (six) hours as needed for itching.    Dispense:  20 tablet    Refill:  0   predniSONE (STERAPRED UNI-PAK 21 TAB) 10 MG (21) TBPK tablet    Sig: Dispense one 6 day pack. Take as directed with food.    Dispense:  21 tablet    Refill:  0      *This clinic note was created using Scientist, clinical (histocompatibility and immunogenetics). Therefore, there may be  occasional mistakes despite careful proofreading.  ?    Domenick Gong, MD 12/25/20 [phone]

## 2022-12-02 ENCOUNTER — Ambulatory Visit
Admission: EM | Admit: 2022-12-02 | Discharge: 2022-12-02 | Disposition: A | Discharge status: Home / Self Care | Payer: Self-pay | Attending: Physician Assistant | Admitting: Physician Assistant

## 2022-12-02 DIAGNOSIS — B354 Tinea corporis: Secondary | ICD-10-CM

## 2022-12-02 MED ORDER — CLOTRIMAZOLE-BETAMETHASONE 1-0.05 % EX CREA: TOPICAL_CREAM | CUTANEOUS | 0 refills | Status: AC

## 2022-12-02 NOTE — ED Provider Notes (Signed)
[facility]    CSN: [id number] Arrival date & time: 12/02/22  1307      History   Chief Complaint Chief Complaint  Patient presents with   Rash    HPI Theodore Shelton is a 52 y.o. male.   Patient here today for evaluation of rash that he has to bilateral axillary area upper arms, thighs that started several days ago.  He states that rash is very itchy.  He has been using over-the-counter Lotrimin without significant improvement.  He denies any fever.  He has not had any trouble breathing or swallowing.  The history is provided by the patient.  Rash Associated symptoms: no fever and no shortness of breath     History reviewed. No pertinent past medical history.  Patient Active Problem List   Diagnosis Date Noted   Drug abuse (HCC) 12/11/2016   Accidental drug overdose    Hyperglycemia     History reviewed. No pertinent surgical history.     Home Medications    Prior to Admission medications   Medication Sig Start Date End Date Taking? Authorizing Provider  clotrimazole-betamethasone (LOTRISONE) cream Apply to affected area 2 times daily prn 12/02/22  Yes Tomi Bamberger, PA-C  hydrOXYzine (ATARAX/VISTARIL) 25 MG tablet Take 1 tablet (25 mg total) by mouth every 6 (six) hours as needed for itching. 12/24/20  Yes Domenick Gong, MD  mupirocin ointment (BACTROBAN) 2 % Apply 1 application topically 2 (two) times daily. 12/24/20   Domenick Gong, MD  predniSONE (STERAPRED UNI-PAK 21 TAB) 10 MG (21) TBPK tablet Dispense one 6 day pack. Take as directed with food. 12/24/20   Domenick Gong, MD    Family History History reviewed. No pertinent family history.  Social History Social History   Tobacco Use   Smoking status: Every Day    Types: Cigarettes   Smokeless tobacco: Current  Substance Use Topics   Alcohol use: Yes   Drug use: Yes     Allergies   Patient has no known allergies.   Review of Systems Review of Systems  Constitutional:   Negative for chills and fever.  Eyes:  Negative for discharge and redness.  Respiratory:  Negative for shortness of breath.   Skin:  Positive for rash.  Neurological:  Negative for numbness.     Physical Exam Triage Vital Signs ED Triage Vitals [12/02/22 1315]  Encounter Vitals Group     BP 114/67     Systolic BP Percentile      Diastolic BP Percentile      Pulse Rate 67     Resp 16     Temp 98.5 F (36.9 C)     Temp Source Oral     SpO2 93 %     Weight      Height      Head Circumference      Peak Flow      Pain Score 6     Pain Loc      Pain Education      Exclude from Growth Chart    No data found.  Updated Vital Signs BP 114/67 (BP Location: Left Arm)   Pulse 67   Temp 98.5 F (36.9 C) (Oral)   Resp 16   SpO2 93%      Physical Exam Vitals and nursing note reviewed.  Constitutional:      General: He is not in acute distress.    Appearance: Normal appearance. He is not ill-appearing.  HENT:  Head: Normocephalic and atraumatic.  Eyes:     Conjunctiva/sclera: Conjunctivae normal.  Cardiovascular:     Rate and Rhythm: Normal rate and regular rhythm.  Pulmonary:     Effort: Pulmonary effort is normal. No respiratory distress.  Musculoskeletal:     Comments: Scattered erythematous annular lesions with central clearing noted to bilateral axillary with well-demarcated borders  Neurological:     Mental Status: He is alert.  Psychiatric:        Mood and Affect: Mood normal.        Behavior: Behavior normal.        Thought Content: Thought content normal.      UC Treatments / Results  Labs (all labs ordered are listed, but only abnormal results are displayed) Labs Reviewed - No data to display  EKG   Radiology No results found.  Procedures Procedures (including critical care time)  Medications Ordered in UC Medications - No data to display  Initial Impression / Assessment and Plan / UC Course  I have reviewed the triage vital signs and  the nursing notes.  Pertinent labs & imaging results that were available during my care of the patient were reviewed by me and considered in my medical decision making (see chart for details).    Rash consistent with tinea infection.  Will treat with clotrimazole/betamethasone and encouraged follow-up if no gradual improvement or further concerns.  Final Clinical Impressions(s) / UC Diagnoses   Final diagnoses:  Tinea corporis   Discharge Instructions   None    ED Prescriptions     Medication Sig Dispense Auth. Provider   clotrimazole-betamethasone (LOTRISONE) cream Apply to affected area 2 times daily prn 30 g Tomi Bamberger, PA-C      PDMP not reviewed this encounter.   Tomi Bamberger, PA-C 12/02/22 [phone]

## 2022-12-02 NOTE — ED Triage Notes (Signed)
Pt presents with itchy red circular rash all over body that started a week ago. Tried benadryl and ibuprofen with Lotrimin AF cream with no relief.
